# Patient Record
Sex: Female | Born: 1990 | Race: Black or African American | Hispanic: No | Marital: Single | Smoking: Former smoker
Health system: Southern US, Community
[De-identification: ages and names within clinical notes are randomized; demographics above are authoritative.]

## PROBLEM LIST (undated history)

## (undated) DIAGNOSIS — F329 Major depressive disorder, single episode, unspecified: Secondary | ICD-10-CM

## (undated) DIAGNOSIS — F32A Depression, unspecified: Secondary | ICD-10-CM

---
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
## 2011-07-21 HISTORY — PX: WISDOM TOOTH EXTRACTION: SHX21

## 2013-07-26 ENCOUNTER — Encounter (HOSPITAL_COMMUNITY): Payer: Self-pay | Admitting: Emergency Medicine

## 2013-07-26 ENCOUNTER — Emergency Department (INDEPENDENT_AMBULATORY_CARE_PROVIDER_SITE_OTHER)
Admission: EM | Admit: 2013-07-26 | Discharge: 2013-07-26 | Disposition: A | Discharge status: Home / Self Care | Payer: Self-pay | Source: Home / Self Care | Attending: Emergency Medicine | Admitting: Emergency Medicine

## 2013-07-26 DIAGNOSIS — K529 Noninfective gastroenteritis and colitis, unspecified: Secondary | ICD-10-CM

## 2013-07-26 DIAGNOSIS — K5289 Other specified noninfective gastroenteritis and colitis: Secondary | ICD-10-CM

## 2013-07-26 LAB — POCT I-STAT, CHEM 8
BUN: 12 mg/dL (ref 6–23)
CALCIUM ION: 1.29 mmol/L — AB (ref 1.12–1.23)
CHLORIDE: 104 meq/L (ref 96–112)
Creatinine, Ser: 0.8 mg/dL (ref 0.50–1.10)
Glucose, Bld: 109 mg/dL — ABNORMAL HIGH (ref 70–99)
HCT: 37 % (ref 36.0–46.0)
Hemoglobin: 12.6 g/dL (ref 12.0–15.0)
Potassium: 3.8 mEq/L (ref 3.7–5.3)
Sodium: 140 mEq/L (ref 137–147)
TCO2: 25 mmol/L (ref 0–100)

## 2013-07-26 MED ORDER — DIPHENOXYLATE-ATROPINE 2.5-0.025 MG PO TABS: 1.0000 | ORAL_TABLET | Freq: Four times a day (QID) | ORAL | Status: DC | PRN

## 2013-07-26 MED ORDER — ONDANSETRON 4 MG PO TBDP
8.0000 mg | ORAL_TABLET | Freq: Once | ORAL | Status: AC
  Administered 2013-07-26: 8 mg via ORAL

## 2013-07-26 MED ORDER — SODIUM CHLORIDE 0.9 % IV SOLN
INTRAVENOUS | Status: DC
  Administered 2013-07-26: 10:00:00 via INTRAVENOUS

## 2013-07-26 MED ORDER — GI COCKTAIL ~~LOC~~
ORAL | Status: AC
  Filled 2013-07-26: qty 30

## 2013-07-26 MED ORDER — ONDANSETRON 4 MG PO TBDP
ORAL_TABLET | ORAL | Status: AC
  Filled 2013-07-26: qty 2

## 2013-07-26 MED ORDER — ONDANSETRON 8 MG PO TBDP: 8.0000 mg | ORAL_TABLET | Freq: Three times a day (TID) | ORAL | Status: DC | PRN

## 2013-07-26 MED ORDER — GI COCKTAIL ~~LOC~~
30.0000 mL | Freq: Once | ORAL | Status: AC
  Administered 2013-07-26: 30 mL via ORAL

## 2013-07-26 NOTE — Discharge Instructions (Signed)

## 2013-07-26 NOTE — ED Notes (Signed)
Pt c/o abd pain onset las night w/sxs that include: diarrhea and vomiting Reports she's had 3 loose stools today and about 6-7 episodes of vomiting Denies: fevers... Has been increasing her fluid intake Alert w/no signs of acute distress.

## 2013-07-26 NOTE — ED Notes (Signed)
Pt is feeling much better.

## 2013-07-26 NOTE — ED Provider Notes (Signed)
Chief Complaint   Chief Complaint  Patient presents with  . Abdominal Pain     History of Present Illness   Anita Christensen is 23 year old female who has had a two-day history of dizziness, headache, nausea, and vomiting. The vomitus was blood-streaked. She vomited 6-7 times today. She also had diarrhea. She had 3 diarrheal stools per day. The diarrhea was also blood-streaked. She's felt weakness, dizziness, and had some muscle cramps. She denies any URI symptoms or abdominal pain. She's had no recent foreign travel, suspicious ingestions, exposures, or animal exposure.  Review of Systems   Other than as noted above, the patient denies any of the following symptoms: Systemic:  No fevers, chills, sweats, weight loss or gain, fatigue, or tiredness. ENT:  No nasal congestion, rhinorrhea, or sore throat. Lungs:  No cough, wheezing, or shortness of breath. Cardiac:  No chest pain, syncope, or presyncope. GI:  No abdominal pain, nausea, vomiting, anorexia, diarrhea, constipation, blood in stool or vomitus. GU:  No dysuria, frequency, or urgency.  PMFSH   Past medical history, family history, social history, meds, and allergies were reviewed. She has Sjogren's syndrome and bipolar disorder. She takes Abilify, Xanax, Lexapro.  Physical Exam     Vital signs:  BP 112/78  Pulse 82  Temp(Src) 98.2 F (36.8 C) (Oral)  Resp 18  Ht 5\' 6"  (1.676 m)  Wt 198 lb (89.812 kg)  BMI 31.97 kg/m2  SpO2 100%  LMP 07/19/2013 Filed Vitals:   07/26/13 0928 07/26/13 1012 Supine  07/26/13 1013 Sitting  07/26/13 1013 Standing   BP: 127/81 116/74 118/70 112/78  Pulse:  62 76 82  Temp: 98.2 F (36.8 C)     TempSrc: Oral     Resp: 18     Height: 5\' 6"  (1.676 m)     Weight: 198 lb (89.812 kg)     SpO2: 100%      General:  Alert and oriented.  In no distress.  Skin warm and dry.  Good skin turgor, brisk capillary refill. ENT:  No scleral icterus, moist mucous membranes, no oral lesions, pharynx  clear. Lungs:  Breath sounds clear and equal bilaterally.  No wheezes, rales, or rhonchi. Heart:  Rhythm regular, without extrasystoles.  No gallops or murmers. Abdomen:  Soft and nontender, no organomegaly or mass. Bowel sounds are hyperactive. Skin: Clear, warm, and dry.  Good turgor.  Brisk capillary refill.  Labs   Results for orders placed during the hospital encounter of 07/26/13  POCT I-STAT, CHEM 8      Result Value Range   Sodium 140  137 - 147 mEq/L   Potassium 3.8  3.7 - 5.3 mEq/L   Chloride 104  96 - 112 mEq/L   BUN 12  6 - 23 mg/dL   Creatinine, Ser 6.04  0.50 - 1.10 mg/dL   Glucose, Bld 540 (*) 70 - 99 mg/dL   Calcium, Ion 9.81 (*) 1.12 - 1.23 mmol/L   TCO2 25  0 - 100 mmol/L   Hemoglobin 12.6  12.0 - 15.0 g/dL   HCT 19.1  47.8 - 29.5 %     Course in Urgent Care Center   She was given Zofran ODT 8 mg sublingually and 1 L of normal saline intravenously. Thereafter she felt better.   Assessment   The encounter diagnosis was Gastroenteritis.  With mild dehydration.  Plan   1.  Meds:  The following meds were prescribed:   Discharge Medication List as of 07/26/2013 11:27 AM  START taking these medications   Details  diphenoxylate-atropine (LOMOTIL) 2.5-0.025 MG per tablet Take 1 tablet by mouth 4 (four) times daily as needed for diarrhea or loose stools., Starting 07/26/2013, Until Discontinued, Print    ondansetron (ZOFRAN ODT) 8 MG disintegrating tablet Take 1 tablet (8 mg total) by mouth every 8 (eight) hours as needed for nausea., Starting 07/26/2013, Until Discontinued, Normal        2.  Patient Education/Counseling:  The patient was given appropriate handouts, self care instructions, and instructed in symptomatic relief. The patient was told to stay on clear liquids for the remainder of the day, then advance to a B.R.A.T. diet starting tomorrow.  3.  Follow up:  The patient was told to follow up here if no better in 2 to 3 days, or sooner if becoming worse  in any way, and given some red flag symptoms such as persistent vomitng, high fever, severe abdominal pain, or any GI bleeding which would prompt immediate return.        Reuben Likesavid C Veva Grimley, MD 07/26/13 [phone]

## 2013-10-03 ENCOUNTER — Emergency Department (INDEPENDENT_AMBULATORY_CARE_PROVIDER_SITE_OTHER)
Admission: EM | Admit: 2013-10-03 | Discharge: 2013-10-03 | Disposition: A | Discharge status: Home / Self Care | Payer: Self-pay | Source: Home / Self Care

## 2013-10-03 ENCOUNTER — Encounter (HOSPITAL_COMMUNITY): Payer: Self-pay | Admitting: Emergency Medicine

## 2013-10-03 DIAGNOSIS — A084 Viral intestinal infection, unspecified: Secondary | ICD-10-CM

## 2013-10-03 DIAGNOSIS — A088 Other specified intestinal infections: Secondary | ICD-10-CM

## 2013-10-03 MED ORDER — ONDANSETRON 4 MG PO TBDP
4.0000 mg | ORAL_TABLET | Freq: Once | ORAL | Status: AC
  Administered 2013-10-03: 4 mg via ORAL

## 2013-10-03 MED ORDER — ONDANSETRON HCL 4 MG PO TABS: 4.0000 mg | ORAL_TABLET | Freq: Three times a day (TID) | ORAL | Status: DC | PRN

## 2013-10-03 MED ORDER — ONDANSETRON 4 MG PO TBDP
ORAL_TABLET | ORAL | Status: AC
  Filled 2013-10-03: qty 1

## 2013-10-03 MED ORDER — SODIUM CHLORIDE 0.9 % IV SOLN
Freq: Once | INTRAVENOUS | Status: AC
  Administered 2013-10-03: 15:00:00 via INTRAVENOUS

## 2013-10-03 NOTE — ED Notes (Signed)
C/o N, V and D onset Sat.  D x 10 on Sat. and continuous. Vomiting onset Sunday V 4-5 times, V x 8 Mon., V 2-3 x today.  No fever noted.  Can't keep water or g'ale down today.  Aching all over and has low abd. pain

## 2013-10-03 NOTE — ED Provider Notes (Signed)
CSN: [id number]     Arrival date & time 10/03/13  1248 History   First MD Initiated Contact with Patient 10/03/13 1431     No chief complaint on file.  (Consider location/radiation/quality/duration/timing/severity/associated sxs/prior Treatment) HPI Comments: 23 year old female complaining of nausea vomiting and diarrhea for 3 days. She has vomited numerous times and diarrhea numerous times as above. The number of the vomiting episodes has decreased in the last few hours he continues to have diarrhea. This is associated with fatigue malaise, myalgias and soreness across the lower abdomen. Denies known fever   History reviewed. No pertinent past medical history. Past Surgical History  Procedure Laterality Date  . Wisdom tooth extraction  2013   Family History  Problem Relation Age of Onset  . Cancer Mother     breaast   History  Substance Use Topics  . Smoking status: Never Smoker   . Smokeless tobacco: Not on file  . Alcohol Use: Yes     Comment: occasional   OB History   Grav Para Term Preterm Abortions TAB SAB Ect Mult Living                 Review of Systems  Constitutional: Positive for activity change, appetite change and fatigue. Negative for fever.  HENT: Negative.   Respiratory: Negative.   Cardiovascular: Negative.   Gastrointestinal: Positive for nausea, vomiting, abdominal pain and diarrhea.  Genitourinary: Negative.   Musculoskeletal: Positive for myalgias. Negative for arthralgias.  Neurological: Negative for tremors, facial asymmetry, speech difficulty and numbness.    Allergies  Review of patient's allergies indicates no known allergies.  Home Medications   Current Outpatient Rx  Name  Route  Sig  Dispense  Refill  . ALPRAZolam (XANAX) 1 MG tablet   Oral   Take 1 mg by mouth at bedtime as needed for anxiety.         . ARIPiprazole (ABILIFY) 10 MG tablet   Oral   Take 10 mg by mouth daily.         . diphenoxylate-atropine (LOMOTIL) 2.5-0.025  MG per tablet   Oral   Take 1 tablet by mouth 4 (four) times daily as needed for diarrhea or loose stools.   16 tablet   0   . Escitalopram Oxalate (LEXAPRO PO)   Oral   Take by mouth.         . ondansetron (ZOFRAN ODT) 8 MG disintegrating tablet   Oral   Take 1 tablet (8 mg total) by mouth every 8 (eight) hours as needed for nausea.   20 tablet   0   . ondansetron (ZOFRAN) 4 MG tablet   Oral   Take 1 tablet (4 mg total) by mouth every 8 (eight) hours as needed for nausea. May cause constipation   20 tablet   0    BP 118/81  Pulse 124  Temp(Src) 98.6 F (37 C) (Oral)  Resp 16  SpO2 99%  LMP 09/19/2013 Physical Exam  Nursing note and vitals reviewed. Constitutional: She is oriented to person, place, and time. She appears well-developed and well-nourished. No distress.  HENT:  Mouth/Throat: Oropharynx is clear and moist. No oropharyngeal exudate.   Bilateral TMs are normal  Eyes: Conjunctivae and EOM are normal.  Neck: Normal range of motion. Neck supple.  Cardiovascular: Normal rate, regular rhythm and normal heart sounds.   Pulmonary/Chest: Effort normal and breath sounds normal. No respiratory distress. She has no wheezes.  Abdominal: Soft. Bowel sounds are normal. She exhibits  no distension and no mass. There is no rebound and no guarding.  Mild tenderness across the lower abdomen.  Musculoskeletal: She exhibits no edema and no tenderness.  Lymphadenopathy:    She has no cervical adenopathy.  Neurological: She is alert and oriented to person, place, and time.  Skin: Skin is warm and dry.  Psychiatric: She has a normal mood and affect.    ED Course  Procedures (including critical care time) Labs Review Labs Reviewed - No data to display Imaging Review No results found.   MDM   1. Viral gastroenteritis      IV NS 1L over an hr.  zofran 4 mg po Rx zofran 4 mg q 6-8 h prn immodium AD 1 tid prn diarrhea stools >34/d. Do not stop your diarrhea, just  slow it down Instructions for diet, clear fluids in small frequent volumes, advance as tolerated.   1625: After 1 L of NS and zofran pt st feels much better. No V or D while here.           D/C home in improved condition. No work for next 3 days. Wash hands as directed.     Hayden Rasmussenavid Juandedios Dudash, NP 10/03/13 [phone]

## 2013-10-03 NOTE — Discharge Instructions (Signed)
Diet for Diarrhea, Adult °Frequent, runny stools (diarrhea) may be caused or worsened by food or drink. Diarrhea may be relieved by changing your diet. Since diarrhea can last up to 7 days, it is easy for you to lose too much fluid from the body and become dehydrated. Fluids that are lost need to be replaced. Along with a modified diet, make sure you drink enough fluids to keep your urine clear or pale yellow. °DIET INSTRUCTIONS °· Ensure adequate fluid intake (hydration): have 1 cup (8 oz) of fluid for each diarrhea episode. Avoid fluids that contain simple sugars or sports drinks, fruit juices, whole milk products, and sodas. Your urine should be clear or pale yellow if you are drinking enough fluids. Hydrate with an oral rehydration solution that you can purchase at pharmacies, retail stores, and online. You can prepare an oral rehydration solution at home by mixing the following ingredients together: °·   tsp table salt. °· ¾ tsp baking soda. °·  tsp salt substitute containing potassium chloride. °· 1  tablespoons sugar. °· 1 L (34 oz) of water. °· Certain foods and beverages may increase the speed at which food moves through the gastrointestinal (GI) tract. These foods and beverages should be avoided and include: °· Caffeinated and alcoholic beverages. °· High-fiber foods, such as raw fruits and vegetables, nuts, seeds, and whole grain breads and cereals. °· Foods and beverages sweetened with sugar alcohols, such as xylitol, sorbitol, and mannitol. °· Some foods may be well tolerated and may help thicken stool including: °· Starchy foods, such as rice, toast, pasta, low-sugar cereal, oatmeal, grits, baked potatoes, crackers, and bagels.   °· Bananas.   °· Applesauce. °· Add probiotic-rich foods to help increase healthy bacteria in the GI tract, such as yogurt and fermented milk products. °RECOMMENDED FOODS AND BEVERAGES °Starches °Choose foods with less than 2 g of fiber per serving. °· Recommended:  White,  French, and pita breads, plain rolls, buns, bagels. Plain muffins, matzo. Soda, saltine, or graham crackers. Pretzels, melba toast, zwieback. Cooked cereals made with water: cornmeal, farina, cream cereals. Dry cereals: refined corn, wheat, rice. Potatoes prepared any way without skins, refined macaroni, spaghetti, noodles, refined rice. °· Avoid:  Bread, rolls, or crackers made with whole wheat, multi-grains, rye, bran seeds, nuts, or coconut. Corn tortillas or taco shells. Cereals containing whole grains, multi-grains, bran, coconut, nuts, raisins. Cooked or dry oatmeal. Coarse wheat cereals, granola. Cereals advertised as "high-fiber." Potato skins. Whole grain pasta, wild or brown rice. Popcorn. Sweet potatoes, yams. Sweet rolls, doughnuts, waffles, pancakes, sweet breads. °Vegetables °· Recommended: Strained tomato and vegetable juices. Most well-cooked and canned vegetables without seeds. Fresh: Tender lettuce, cucumber without the skin, cabbage, spinach, bean sprouts. °· Avoid: Fresh, cooked, or canned: Artichokes, baked beans, beet greens, broccoli, Brussels sprouts, corn, kale, legumes, peas, sweet potatoes. Cooked: Green or red cabbage, spinach. Avoid large servings of any vegetables because vegetables shrink when cooked, and they contain more fiber per serving than fresh vegetables. °Fruit °· Recommended: Cooked or canned: Apricots, applesauce, cantaloupe, cherries, fruit cocktail, grapefruit, grapes, kiwi, mandarin oranges, peaches, pears, plums, watermelon. Fresh: Apples without skin, ripe banana, grapes, cantaloupe, cherries, grapefruit, peaches, oranges, plums. Keep servings limited to ½ cup or 1 piece. °· Avoid: Fresh: Apples with skin, apricots, mangoes, pears, raspberries, strawberries. Prune juice, stewed or dried prunes. Dried fruits, raisins, dates. Large servings of all fresh fruits. °Protein °· Recommended: Ground or well-cooked tender beef, ham, veal, lamb, pork, or poultry. Eggs. Fish,  oysters, shrimp,   lobster, other seafoods. Liver, organ meats. °· Avoid: Tough, fibrous meats with gristle. Peanut butter, smooth or chunky. Cheese, nuts, seeds, legumes, dried peas, beans, lentils. °Dairy °· Recommended: Yogurt, lactose-free milk, kefir, drinkable yogurt, buttermilk, soy milk, or plain hard cheese. °· Avoid: Milk, chocolate milk, beverages made with milk, such as milkshakes. °Soups °· Recommended: Bouillon, broth, or soups made from allowed foods. Any strained soup. °· Avoid: Soups made from vegetables that are not allowed, cream or milk-based soups. °Desserts and Sweets °· Recommended: Sugar-free gelatin, sugar-free frozen ice pops made without sugar alcohol. °· Avoid: Plain cakes and cookies, pie made with fruit, pudding, custard, cream pie. Gelatin, fruit, ice, sherbet, frozen ice pops. Ice cream, ice milk without nuts. Plain hard candy, honey, jelly, molasses, syrup, sugar, chocolate syrup, gumdrops, marshmallows. °Fats and Oils °· Recommended: Limit fats to less than 8 tsp per day. °· Avoid: Seeds, nuts, olives, avocados. Margarine, butter, cream, mayonnaise, salad oils, plain salad dressings. Plain gravy, crisp bacon without rind. °Beverages °· Recommended: Water, decaffeinated teas, oral rehydration solutions, sugar-free beverages not sweetened with sugar alcohols. °· Avoid: Fruit juices, caffeinated beverages (coffee, tea, soda), alcohol, sports drinks, or lemon-lime soda. °Condiments °· Recommended: Ketchup, mustard, horseradish, vinegar, cocoa powder. Spices in moderation: allspice, basil, bay leaves, celery powder or leaves, cinnamon, cumin powder, curry powder, ginger, mace, marjoram, onion or garlic powder, oregano, paprika, parsley flakes, ground pepper, rosemary, sage, savory, tarragon, thyme, turmeric. °· Avoid: Coconut, honey. °Document Released: 09/26/2003 Document Revised: 03/30/2012 Document Reviewed: 11/20/2011 °ExitCare® Patient Information ©2014 [employer]. ° °Viral  Gastroenteritis °Viral gastroenteritis is also known as stomach flu. This condition affects the stomach and intestinal tract. It can cause sudden diarrhea and vomiting. The illness typically lasts 3 to 8 days. Most people develop an immune response that eventually gets rid of the virus. While this natural response develops, the virus can make you quite ill. °CAUSES  °Many different viruses can cause gastroenteritis, such as rotavirus or noroviruses. You can catch one of these viruses by consuming contaminated food or water. You may also catch a virus by sharing utensils or other personal items with an infected person or by touching a contaminated surface. °SYMPTOMS  °The most common symptoms are diarrhea and vomiting. These problems can cause a severe loss of body fluids (dehydration) and a body salt (electrolyte) imbalance. Other symptoms may include: °· Fever. °· Headache. °· Fatigue. °· Abdominal pain. °DIAGNOSIS  °Your caregiver can usually diagnose viral gastroenteritis based on your symptoms and a physical exam. A stool sample may also be taken to test for the presence of viruses or other infections. °TREATMENT  °This illness typically goes away on its own. Treatments are aimed at rehydration. The most serious cases of viral gastroenteritis involve vomiting so severely that you are not able to keep fluids down. In these cases, fluids must be given through an intravenous line (IV). °HOME CARE INSTRUCTIONS  °· Drink enough fluids to keep your urine clear or pale yellow. Drink small amounts of fluids frequently and increase the amounts as tolerated. °· Ask your caregiver for specific rehydration instructions. °· Avoid: °· Foods high in sugar. °· Alcohol. °· Carbonated drinks. °· Tobacco. °· Juice. °· Caffeine drinks. °· Extremely hot or cold fluids. °· Fatty, greasy foods. °· Too much intake of anything at one time. °· Dairy products until 24 to 48 hours after diarrhea stops. °· You may consume probiotics.  Probiotics are active cultures of beneficial bacteria. They may lessen the amount and number   of diarrheal stools in adults. Probiotics can be found in yogurt with active cultures and in supplements. °· Wash your hands well to avoid spreading the virus. °· Only take over-the-counter or prescription medicines for pain, discomfort, or fever as directed by your caregiver. Do not give aspirin to children. Antidiarrheal medicines are not recommended. °· Ask your caregiver if you should continue to take your regular prescribed and over-the-counter medicines. °· Keep all follow-up appointments as directed by your caregiver. °SEEK IMMEDIATE MEDICAL CARE IF:  °· You are unable to keep fluids down. °· You do not urinate at least once every 6 to 8 hours. °· You develop shortness of breath. °· You notice blood in your stool or vomit. This may look like coffee grounds. °· You have abdominal pain that increases or is concentrated in one small area (localized). °· You have persistent vomiting or diarrhea. °· You have a fever. °· The patient is a child younger than 3 months, and he or she has a fever. °· The patient is a child older than 3 months, and he or she has a fever and persistent symptoms. °· The patient is a child older than 3 months, and he or she has a fever and symptoms suddenly get worse. °· The patient is a baby, and he or she has no tears when crying. °MAKE SURE YOU:  °· Understand these instructions. °· Will watch your condition. °· Will get help right away if you are not doing well or get worse. °Document Released: 07/06/2005 Document Revised: 09/28/2011 Document Reviewed: 04/22/2011 °ExitCare® Patient Information ©2014 [employer]. ° °

## 2013-10-03 NOTE — ED Provider Notes (Signed)
Medical screening examination/treatment/procedure(s) were performed by resident physician or non-physician practitioner and as supervising physician I was immediately available for consultation/collaboration.   Barkley BrunsKINDL,Jackeline Gutknecht DOUGLAS MD.   Linna HoffJames D Deejay Koppelman, MD 10/03/13 [phone]

## 2013-10-03 NOTE — ED Notes (Signed)
Drank 1 cup of water without vomiting.  Went BR x 2 while here and had diarrhea.

## 2013-10-12 NOTE — ED Notes (Signed)
Accessed record to provide patient with a note --reprint of existing note

## 2015-08-07 ENCOUNTER — Ambulatory Visit: Payer: Self-pay | Admitting: Gynecology

## 2015-08-12 ENCOUNTER — Ambulatory Visit: Payer: Self-pay | Admitting: Gynecology

## 2015-12-05 ENCOUNTER — Ambulatory Visit (HOSPITAL_COMMUNITY)
Admission: EM | Admit: 2015-12-05 | Discharge: 2015-12-05 | Disposition: A | Discharge status: Home / Self Care | Payer: 59 | Attending: Physician Assistant | Admitting: Physician Assistant

## 2015-12-05 ENCOUNTER — Encounter (HOSPITAL_COMMUNITY): Payer: Self-pay | Admitting: Emergency Medicine

## 2015-12-05 DIAGNOSIS — L03032 Cellulitis of left toe: Secondary | ICD-10-CM | POA: Diagnosis not present

## 2015-12-05 MED ORDER — CEPHALEXIN 500 MG PO CAPS
500.0000 mg | ORAL_CAPSULE | Freq: Four times a day (QID) | ORAL | Status: DC
Start: 2015-12-05 — End: 2015-12-14

## 2015-12-05 NOTE — ED Notes (Signed)
Pt has been having issues with an ingrown toenail on her left great toe.  Pt has pain when standing for long periods of time or when something touches it or wearing closed toed shoes.

## 2015-12-05 NOTE — Discharge Instructions (Signed)
Paronychia  °Paronychia is an infection of the skin. It happens near a fingernail or toenail. It may cause pain and swelling around the nail. Usually, it is not serious and it clears up with treatment. °HOME CARE °· Soak the fingers or toes in warm water as told by your doctor. You may be told to do this for 20 minutes, 2-3 times a day. °· Keep the area dry when you are not soaking it. °· Take medicines only as told by your doctor. °· If you were given an antibiotic medicine, finish all of it even if you start to feel better. °· Keep the affected area clean. °· Do not try to drain a fluid-filled bump yourself. °· Wear rubber gloves when putting your hands in water. °· Wear gloves if your hands might touch cleaners or chemicals. °· Follow your doctor's instructions about: °¨ Wound care. °¨ Bandage (dressing) changes and removal. °GET HELP IF: °· Your symptoms get worse or do not improve. °· You have a fever or chills. °· You have redness spreading from the affected area. °· You have more fluid, blood, or pus coming from the affected area. °· Your finger or knuckle is swollen or is hard to move. °  °This information is not intended to replace advice given to you by your health care provider. Make sure you discuss any questions you have with your health care provider. °  °Document Released: 06/24/2009 Document Revised: 11/20/2014 Document Reviewed: 06/13/2014 °Elsevier Interactive Patient Education ©2016 [employer] ° °

## 2015-12-14 ENCOUNTER — Ambulatory Visit (HOSPITAL_COMMUNITY)
Admission: EM | Admit: 2015-12-14 | Discharge: 2015-12-14 | Disposition: A | Discharge status: Home / Self Care | Payer: 59 | Attending: Emergency Medicine | Admitting: Emergency Medicine

## 2015-12-14 ENCOUNTER — Encounter (HOSPITAL_COMMUNITY): Payer: Self-pay | Admitting: *Deleted

## 2015-12-14 DIAGNOSIS — L600 Ingrowing nail: Secondary | ICD-10-CM

## 2015-12-14 HISTORY — DX: Depression, unspecified: F32.A

## 2015-12-14 HISTORY — DX: Major depressive disorder, single episode, unspecified: F32.9

## 2015-12-14 MED ORDER — CEPHALEXIN 500 MG PO CAPS: 500.0000 mg | ORAL_CAPSULE | Freq: Four times a day (QID) | ORAL | Status: DC

## 2015-12-14 NOTE — Discharge Instructions (Signed)
The toenail is ingrown. This is why you are getting recurrent infections. Your toenail, or at least the involved part, needs to be removed. Take Keflex 4 times a day for 1 week. Soak the toe in Epsom salts twice a day for the next week. Call [facility] on Tuesday to set up an appointment to remove the toenail.

## 2015-12-14 NOTE — ED Provider Notes (Signed)
CSN: [id number]     Arrival date & time 12/14/15  1333 History   First MD Initiated Contact with Patient 12/14/15 1444     Chief Complaint  Patient presents with  . Ingrown Toenail   (Consider location/radiation/quality/duration/timing/severity/associated sxs/prior Treatment) HPI  She is a 25 year old woman here for evaluation of left ingrown toenail. She states this has been a recurring problem for several months. She was seen here about 2 weeks ago and treated with antibiotics. She states the toenail did improve, but then she stubbed it and developed recurring pain, redness, swelling, and drainage. She has been doing Epsom salt soaks about every other day.  Past Medical History  Diagnosis Date  . Depression    Past Surgical History  Procedure Laterality Date  . Wisdom tooth extraction  2013   Family History  Problem Relation Age of Onset  . Cancer Mother     breaast   Social History  Substance Use Topics  . Smoking status: Never Smoker   . Smokeless tobacco: None  . Alcohol Use: Yes     Comment: occasional   OB History    No data available     Review of Systems As in history of present illness Allergies  Review of patient's allergies indicates no known allergies.  Home Medications   Prior to Admission medications   Medication Sig Start Date End Date Taking? Authorizing Provider  ALPRAZolam Prudy Feeler(XANAX) 1 MG tablet Take 1 mg by mouth at bedtime as needed for anxiety.   Yes Historical Provider, MD  cephALEXin (KEFLEX) 500 MG capsule Take 1 capsule (500 mg total) by mouth 4 (four) times daily. 12/14/15   Charm RingsErin J Honig, MD   Meds Ordered and Administered this Visit  Medications - No data to display  BP 138/85 mmHg  Pulse 75  Temp(Src) 98.2 F (36.8 C) (Oral)  Resp 16  SpO2 100%  LMP 12/13/2015 No data found.   Physical Exam  Constitutional: She is oriented to person, place, and time. She appears well-developed and well-nourished. No distress.  Cardiovascular:  Normal rate.   Pulmonary/Chest: Effort normal.  Neurological: She is alert and oriented to person, place, and time.  Skin:  Left great toe: There is erythema and swelling along the lateral nail edge. This is quite tender to palpation. I'm unable to express any pus today. Toenail is obviously ingrown, worse on the lateral aspect.    ED Course  Procedures (including critical care time)  Labs Review Labs Reviewed - No data to display  Imaging Review No results found.   MDM   1. Ingrown left big toenail    Offered toenail removal today. Given the tools available to me, I discussed with her that I may need to remove the entire toenail rather than just the lateral third. She would prefer to avoid that if possible. As an alternative, we have decided to repeat the course of Keflex and increase Epsom salt soaks. She will follow-up at [facility] for lateral nail removal next week.    Charm RingsErin J Honig, MD 12/14/15 [id number]

## 2015-12-14 NOTE — ED Notes (Signed)
Patient here for follow up after visit on 5/18 for ingrown toenail. Patient reports completing full course of antibiotic and has not seen any relief. Also notes hitting to and dropping something on it yesterday. Obvious infection noted to left lateral distal aspect of let toe. Pain increases with touching, pressure, and standing.

## 2015-12-30 ENCOUNTER — Encounter: Payer: Self-pay | Admitting: Podiatry

## 2015-12-30 ENCOUNTER — Ambulatory Visit (INDEPENDENT_AMBULATORY_CARE_PROVIDER_SITE_OTHER): Payer: 59 | Admitting: Podiatry

## 2015-12-30 VITALS — BP 102/76 | HR 92 | Resp 12

## 2015-12-30 DIAGNOSIS — L600 Ingrowing nail: Secondary | ICD-10-CM

## 2015-12-30 DIAGNOSIS — R5383 Other fatigue: Secondary | ICD-10-CM

## 2015-12-30 DIAGNOSIS — R5381 Other malaise: Secondary | ICD-10-CM | POA: Insufficient documentation

## 2015-12-30 DIAGNOSIS — M2550 Pain in unspecified joint: Secondary | ICD-10-CM | POA: Insufficient documentation

## 2015-12-30 MED ORDER — NEOMYCIN-POLYMYXIN-HC 1 % OT SOLN: OTIC | Status: DC

## 2015-12-30 NOTE — Progress Notes (Signed)
   Subjective:    Patient ID: Anita LoronBrandi N Lupi, female    DOB: [birth year], 25 y.o.   MRN: [id number]  HPI: She presents today with a one-month duration of an ingrown nail border hallux left.    Review of Systems  Skin: Positive for color change.       Objective:   Physical Exam: Vital signs are stable alert and oriented 3. Pulses are palpable. Neurologic system is intact. Degenerative flexor intact. Muscle strength is normal bilateral. Orthopedic evaluation demonstrates although is full range of motion at crepitation. Extensive admission physical under cutis shuffling a nail margins were hallux left. Initial tissues present. No malodor no drainage.        Assessment & Plan:  Ingrown nail paronychia abscess fever or hallux left.  Plan: Chemical matrixectomy was performed today after local anesthesia was diminished. She tolerated the procedure well. No complications. Start her on Cortisporin Otic after soaking twice a day in Betadine warm water. We will follow up with her in 1 week for reevaluation.

## 2015-12-30 NOTE — Patient Instructions (Signed)

## 2016-01-10 ENCOUNTER — Ambulatory Visit: Payer: Self-pay | Admitting: Podiatry

## 2016-01-13 ENCOUNTER — Ambulatory Visit: Payer: 59 | Admitting: Podiatry

## 2016-02-05 ENCOUNTER — Ambulatory Visit: Payer: 59 | Admitting: Podiatry

## 2016-12-02 ENCOUNTER — Encounter: Payer: Self-pay | Admitting: Gynecology

## 2018-09-01 ENCOUNTER — Encounter: Payer: Self-pay | Admitting: *Deleted

## 2018-09-01 ENCOUNTER — Other Ambulatory Visit: Payer: Self-pay

## 2018-09-01 ENCOUNTER — Emergency Department (INDEPENDENT_AMBULATORY_CARE_PROVIDER_SITE_OTHER)
Admission: EM | Admit: 2018-09-01 | Discharge: 2018-09-01 | Disposition: A | Discharge status: Home / Self Care | Payer: Self-pay | Source: Home / Self Care

## 2018-09-01 DIAGNOSIS — M542 Cervicalgia: Secondary | ICD-10-CM

## 2018-09-01 MED ORDER — METHOCARBAMOL 500 MG PO TABS: 500.0000 mg | ORAL_TABLET | Freq: Two times a day (BID) | ORAL | 0 refills | Status: AC

## 2018-09-01 MED ORDER — IBUPROFEN 600 MG PO TABS: 600.0000 mg | ORAL_TABLET | Freq: Four times a day (QID) | ORAL | 0 refills | Status: AC | PRN
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
## 2018-09-01 NOTE — Discharge Instructions (Addendum)
Return if any problems.

## 2018-11-15 ENCOUNTER — Other Ambulatory Visit: Payer: Self-pay

## 2018-11-15 ENCOUNTER — Emergency Department (HOSPITAL_BASED_OUTPATIENT_CLINIC_OR_DEPARTMENT_OTHER)
Admission: EM | Admit: 2018-11-15 | Discharge: 2018-11-15 | Disposition: A | Discharge status: Home / Self Care | Payer: Managed Care, Other (non HMO) | Attending: Emergency Medicine | Admitting: Emergency Medicine

## 2018-11-15 ENCOUNTER — Encounter (HOSPITAL_BASED_OUTPATIENT_CLINIC_OR_DEPARTMENT_OTHER): Payer: Self-pay | Admitting: Adult Health

## 2018-11-15 ENCOUNTER — Emergency Department (HOSPITAL_BASED_OUTPATIENT_CLINIC_OR_DEPARTMENT_OTHER): Payer: Managed Care, Other (non HMO)

## 2018-11-15 DIAGNOSIS — R05 Cough: Secondary | ICD-10-CM | POA: Diagnosis not present

## 2018-11-15 DIAGNOSIS — R0602 Shortness of breath: Secondary | ICD-10-CM | POA: Insufficient documentation

## 2018-11-15 DIAGNOSIS — F17210 Nicotine dependence, cigarettes, uncomplicated: Secondary | ICD-10-CM | POA: Insufficient documentation

## 2018-11-15 DIAGNOSIS — Z20822 Contact with and (suspected) exposure to covid-19: Secondary | ICD-10-CM

## 2018-11-15 DIAGNOSIS — Z20828 Contact with and (suspected) exposure to other viral communicable diseases: Secondary | ICD-10-CM | POA: Diagnosis not present

## 2018-11-15 DIAGNOSIS — R059 Cough, unspecified: Secondary | ICD-10-CM

## 2018-11-15 MED ORDER — AEROCHAMBER PLUS FLO-VU MEDIUM MISC
1.0000 | Freq: Once | Status: AC
  Administered 2018-11-15: 11:00:00 1
  Filled 2018-11-15: qty 1

## 2018-11-15 MED ORDER — ALBUTEROL SULFATE HFA 108 (90 BASE) MCG/ACT IN AERS
2.0000 | INHALATION_SPRAY | Freq: Once | RESPIRATORY_TRACT | Status: AC
Start: 2018-11-15 — End: 2018-11-15
  Administered 2018-11-15: 11:00:00 2 via RESPIRATORY_TRACT
  Filled 2018-11-15: qty 6.7

## 2018-11-15 MED ORDER — BENZONATATE 100 MG PO CAPS: 100.0000 mg | ORAL_CAPSULE | Freq: Three times a day (TID) | ORAL | 0 refills | Status: DC

## 2018-11-15 MED ORDER — ONDANSETRON 4 MG PO TBDP: 4.0000 mg | ORAL_TABLET | Freq: Three times a day (TID) | ORAL | 0 refills | Status: AC | PRN

## 2018-11-15 MED FILL — ONDANSETRON ODT 4 MG TABLET: 4 | 6 days supply | Qty: 20 | Fill #0

## 2018-11-15 MED FILL — BENZONATATE 100 MG CAP: 100 | 7 days supply | Qty: 21 | Fill #0

## 2018-11-15 NOTE — Discharge Instructions (Addendum)
Your chest xray showed no evidence of acute abnormalities.  Patients who have symptoms consistent with COVID-19 should self isolated for: At least 3 days (72 hours) have passed since recovery, defined as resolution of fever without the use of fever reducing medications and improvement in respiratory symptoms (e.g., cough, shortness of breath), and At least 7 days have passed since symptoms first appeared.  For further information on COVID-19, you may refer to these resources: PrepaidPayroll.cahttps://www.[facility].edu/coronavirus https://king.net/https://www.[place].com/wellness/covid-19-information/ OptionRunner.ishttps://www.[place].gov/our-county/administration/coronavirus-updates https://www.[place].gov/i-want-to/learn-about/covid-19-information-and-updates  If you need assistance, financial or otherwise, especially due to self-isolation from COVID-19, please explore these resources: https://www.riddle-martin.info/https://www.nc211.org/coronavirus-covid-19 http://www.loveoutloudws.com/  To see needed items you are able to donate to our hospital systems or to those in need during this time, the sites below are good places to start: VerifiedStats.huhttps://www.[facility].edu/About-Us/Philanthropy/COVID19-How-You-Can-Help WikiOutlook.huhttps://www.[place].com/wellness/covid-19-information/covid-19-donations/  Your symptoms are likely consistent with a viral illness. Viruses do not require or respond to antibiotics. Treatment is symptomatic care and it is important to note that these symptoms may last for 7-14 days.   Hand washing: Wash your hands throughout the day, but especially before and after touching the face, using the restroom, sneezing, coughing, or touching surfaces that have been coughed or sneezed upon. Hydration: Symptoms of most illnesses will be intensified and complicated by dehydration. Dehydration can also extend the duration of symptoms. Drink plenty of fluids and get plenty of rest. You should be drinking at least half a liter of water an hour to stay  hydrated. Electrolyte drinks (ex. Gatorade, Powerade, Pedialyte) are also encouraged. You should be drinking enough fluids to make your urine light yellow, almost clear. If this is not the case, you are not drinking enough water. Please note that some of the treatments indicated below will not be effective if you are not adequately hydrated. Diet: Please concentrate on hydration, however, you may introduce food slowly.  Start with a clear liquid diet, progressed to a full liquid diet, and then bland solids as you are able. Pain or fever: Ibuprofen, Naproxen, or acetaminophen (generic for Tylenol) for pain or fever.  Antiinflammatory medications: Take 600 mg of ibuprofen every 6 hours or 440 mg (over the counter dose) to 500 mg (prescription dose) of naproxen every 12 hours for the next 3 days. After this time, these medications may be used as needed for pain. Take these medications with food to avoid upset stomach. Choose only one of these medications, do not take them together. Acetaminophen (generic for Tylenol): Should you continue to have additional pain while taking the ibuprofen or naproxen, you may add in acetaminophen as needed. Your daily total maximum amount of acetaminophen from all sources should be limited to 4000mg /day for persons without liver problems, or 2000mg /day for those with liver problems. Nausea/vomiting: Use the ondansetron (generic for Zofran) for nausea or vomiting.  This medication may not prevent all vomiting or nausea, but can help facilitate better hydration. Things that can help with nausea/vomiting also include peppermint/menthol candies, vitamin B12, and ginger. Diarrhea: May use medications such as loperamide (Imodium) or Bismuth subsalicylate (Pepto-Bismol). Cough: Use the benzonatate (generic for Tessalon) for cough.  Teas, warm liquids, broths, and honey can also help with cough. Albuterol: May use the albuterol as needed for instances of shortness of breath. Zyrtec  or Claritin: May add these medication daily to control underlying symptoms of congestion, sneezing, and other signs of allergies.  These medications are available over-the-counter. Generics: Cetirizine (generic for Zyrtec) and loratadine (generic for Claritin). Fluticasone: Use fluticasone (generic for Flonase), as directed, for nasal and sinus congestion.  This medication is available over-the-counter. Congestion: Plain guaifenesin (generic for plain Mucinex) may help relieve congestion. Saline sinus rinses and saline nasal sprays may also help relieve congestion. If you do not have high blood pressure, heart problems, or an allergy to such medications, you may also try phenylephrine or Sudafed. Sore throat: Warm liquids or Chloraseptic spray may help soothe a sore throat. Gargle twice a day with a salt water solution made from a half teaspoon of salt in a cup of warm water.  Follow up: Follow up with a primary care provider within the next two weeks should symptoms fail to resolve. Return: Return to the ED for significantly worsening symptoms, shortness of breath, persistent vomiting, large amounts of blood in stool, or any other major concerns.  For prescription assistance, may try using prescription discount sites or apps, such as goodrx.com
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
## 2018-11-15 NOTE — ED Provider Notes (Signed)
[facility] EMERGENCY DEPARTMENT Provider Note   CSN: [id number] Arrival date & time: 11/15/18  8657    History   Chief Complaint Chief Complaint  Patient presents with  . Shortness of Breath    HPI Anita Christensen is a 28 y.o. female.     HPI   Anita Christensen is a 28 y.o. female, with a history of depression, presenting to the ED with nonproductive cough and shortness of breath for the past 3 to 4 days.  Preceded by chills, fatigue, body aches, nausea, vomiting, diarrhea, nasal congestion, and rhinorrhea. 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  Denies fever, chest pain, syncope, abdominal pain, hematochezia/melena, rash, neck pain/stiffness, or any other complaints.    Past Medical History:  Diagnosis Date  . Depression     Patient Active Problem List   Diagnosis Date Noted  . Malaise and fatigue 12/30/2015  . Arthralgia of multiple joints 12/30/2015    Past Surgical History:  Procedure Laterality Date  . WISDOM TOOTH EXTRACTION  2013     OB History   No obstetric history on file.      Home Medications    Prior to Admission medications   Medication Sig Start Date End Date Taking? Authorizing Provider  benzonatate (TESSALON) 100 MG capsule Take 1 capsule (100 mg total) by mouth every 8 (eight) hours. 11/15/18   Makenize Messman C, PA-C  ibuprofen (ADVIL,MOTRIN) 600 MG tablet Take 1 tablet (600 mg total) by mouth every 6 (six) hours as needed. 09/01/18   Elson Areas, PA-C  methocarbamol (ROBAXIN) 500 MG tablet Take 1 tablet (500 mg total) by mouth 2 (two) times daily. 09/01/18   Elson Areas, PA-C  ondansetron (ZOFRAN ODT) 4 MG disintegrating tablet Take 1 tablet (4 mg total) by mouth every 8 (eight) hours as needed for nausea or vomiting. 11/15/18   Jorge Retz, Hillard Danker, PA-C    Family History Family History  Problem Relation Age of Onset  . Cancer Mother    breaast    Social History Social History   Tobacco Use  . Smoking status: Current Every Day Smoker    Packs/day: 0.25    Types: Cigarettes  . Smokeless tobacco: Never Used  Substance Use Topics  . Alcohol use: Yes    Comment: occasional  . Drug use: No     Allergies   Patient has no known allergies.   Review of Systems Review of Systems  Constitutional: Positive for chills and fatigue. Negative for fever.  HENT: Positive for congestion and rhinorrhea. Negative for ear discharge, ear pain, sinus pressure, sinus pain, sore throat, trouble swallowing and voice change.   Respiratory: Positive for cough and shortness of breath. Negative for chest tightness.   Cardiovascular: Negative for chest pain, palpitations and leg swelling.  Gastrointestinal: Positive for diarrhea, nausea and vomiting. Negative for abdominal pain and blood in stool.  Musculoskeletal: Positive for myalgias. Negative for back pain, neck pain and neck stiffness.  Skin: Negative for rash.  Neurological: Negative for weakness.  Psychiatric/Behavioral: Negative for confusion.  All other systems reviewed and are negative.    Physical Exam Updated Vital Signs BP (!) 119/99 (BP Location: Left Arm)   Pulse (!) 103   Temp 98.3 F (36.8 C) (Oral)   Resp (!) 24   Ht  (1.676 m)   Wt 90.7 kg   LMP 11/08/2018 (Exact  Date)   SpO2 100%   BMI 32.28 kg/m   Physical Exam Vitals signs and nursing note reviewed.  Constitutional:      General: She is not in acute distress.    Appearance: She is well-developed. She is not diaphoretic.  HENT:     Head: Normocephalic and atraumatic.     Nose: Nose normal.     Mouth/Throat:     Mouth: Mucous membranes are moist.     Pharynx: Oropharynx is clear.  Eyes:     Conjunctiva/sclera: Conjunctivae normal.  Neck:     Musculoskeletal: Normal range of motion and neck supple. No neck rigidity.  Cardiovascular:     Rate and Rhythm: Normal rate and regular rhythm.      Pulses: Normal pulses.     Heart sounds: Normal heart sounds.     Comments: Tactile temperature in the extremities appropriate and equal bilaterally.  Pulse of 96 during my exam. Pulmonary:     Effort: Pulmonary effort is normal. No respiratory distress.     Breath sounds: Examination of the right-lower field reveals wheezing. Examination of the left-lower field reveals wheezing. Wheezing present.     Comments: No increased work of breathing.  Speaks in full sentences without noted difficulty. Abdominal:     Palpations: Abdomen is soft.     Tenderness: There is no abdominal tenderness. There is no guarding.  Musculoskeletal:     Right lower leg: No edema.     Left lower leg: No edema.  Lymphadenopathy:     Cervical: No cervical adenopathy.  Skin:    General: Skin is warm and dry.  Neurological:     Mental Status: She is alert.  Psychiatric:        Mood and Affect: Mood and affect normal.        Speech: Speech normal.        Behavior: Behavior normal.      ED Treatments / Results  Labs (all labs ordered are listed, but only abnormal results are displayed) Labs Reviewed - No data to display  EKG None  Radiology Dg Chest Portable 1 View  Result Date: 11/15/2018 CLINICAL DATA:  Cough, shortness of breath EXAM: PORTABLE CHEST 1 VIEW COMPARISON:  None. FINDINGS: The heart size and mediastinal contours are within normal limits. Both lungs are clear. The visualized skeletal structures are unremarkable. IMPRESSION: No active disease. Electronically Signed   By: Elige Ko   On: 11/15/2018 10:56    Procedures Procedures (including critical care time)  Medications Ordered in ED Medications  albuterol (VENTOLIN HFA) 108 (90 Base) MCG/ACT inhaler 2 puff (2 puffs Inhalation Given 11/15/18 1100)  AeroChamber Plus Flo-Vu Medium MISC 1 each (1 each Other Given 11/15/18 1101)     Initial Impression / Assessment and Plan / ED Course  I have reviewed the triage vital signs and the  nursing notes.  Pertinent labs & imaging results that were available during my care of the patient were reviewed by me and considered in my medical decision making (see chart for details).        Patient presents with cough, shortness of breath, and diarrhea.  Patient is nontoxic appearing, afebrile, not tachycardic on my exam, not tachypneic on my exam, not hypotensive, excellent SPO2 on room air, and is in no apparent distress.  Exposure to someone with confirmed COVID-19.  Suspect COVID-19 infection or other viral illness. No acute abnormality on chest x-ray. The patient was given instructions for home care as well  as return precautions. Patient voices understanding of these instructions, accepts the plan, and is comfortable with discharge.    Radiological studies were personally reviewed by me.   The possibility for viral cause for the patient's symptoms, including the possibility for COVID-19 infection, was discussed.  Per current State of [place] and [place] recommendations, we will not be testing the patient for influenza, COVID-19, or other respiratory viruses.  Patient does not meet criteria for admission or respiratory support at this time.  Reasoning for not testing as well as isolation recommendation and cessation of isolation criteria discussed.  Lewie LoronBrandi N Souter was evaluated in Emergency Department on 11/15/2018 for the symptoms described in the history of present illness. She was evaluated in the context of the global COVID-19 pandemic, which necessitated consideration that the patient might be at risk for infection with the SARS-CoV-2 virus that causes COVID-19. Institutional protocols and algorithms that pertain to the evaluation of patients at risk for COVID-19 are in a state of rapid change based on information released by regulatory bodies including the CDC and federal and state organizations. These policies and algorithms were followed during the patient's care in the ED.     Final Clinical Impressions(s) / ED Diagnoses   Final diagnoses:  Exposure to Covid-19 Virus  Shortness of breath  Cough    ED Discharge Orders         Ordered    ondansetron (ZOFRAN ODT) 4 MG disintegrating tablet  Every 8 hours PRN     11/15/18 1123    benzonatate (TESSALON) 100 MG capsule  Every 8 hours     11/15/18 1123           Anselm PancoastJoy, Malani Lees C, PA-C 11/15/18 1127    Little, Ambrose Finlandachel Morgan, MD 11/15/18 1218

## 2020-10-06 IMAGING — DX PORTABLE CHEST - 1 VIEW
1 series · 1 of 1 positions shown · non-contrast
Comparison: None.

CLINICAL DATA: Cough, shortness of breath

EXAM:
PORTABLE CHEST 1 VIEW

[chest ap]
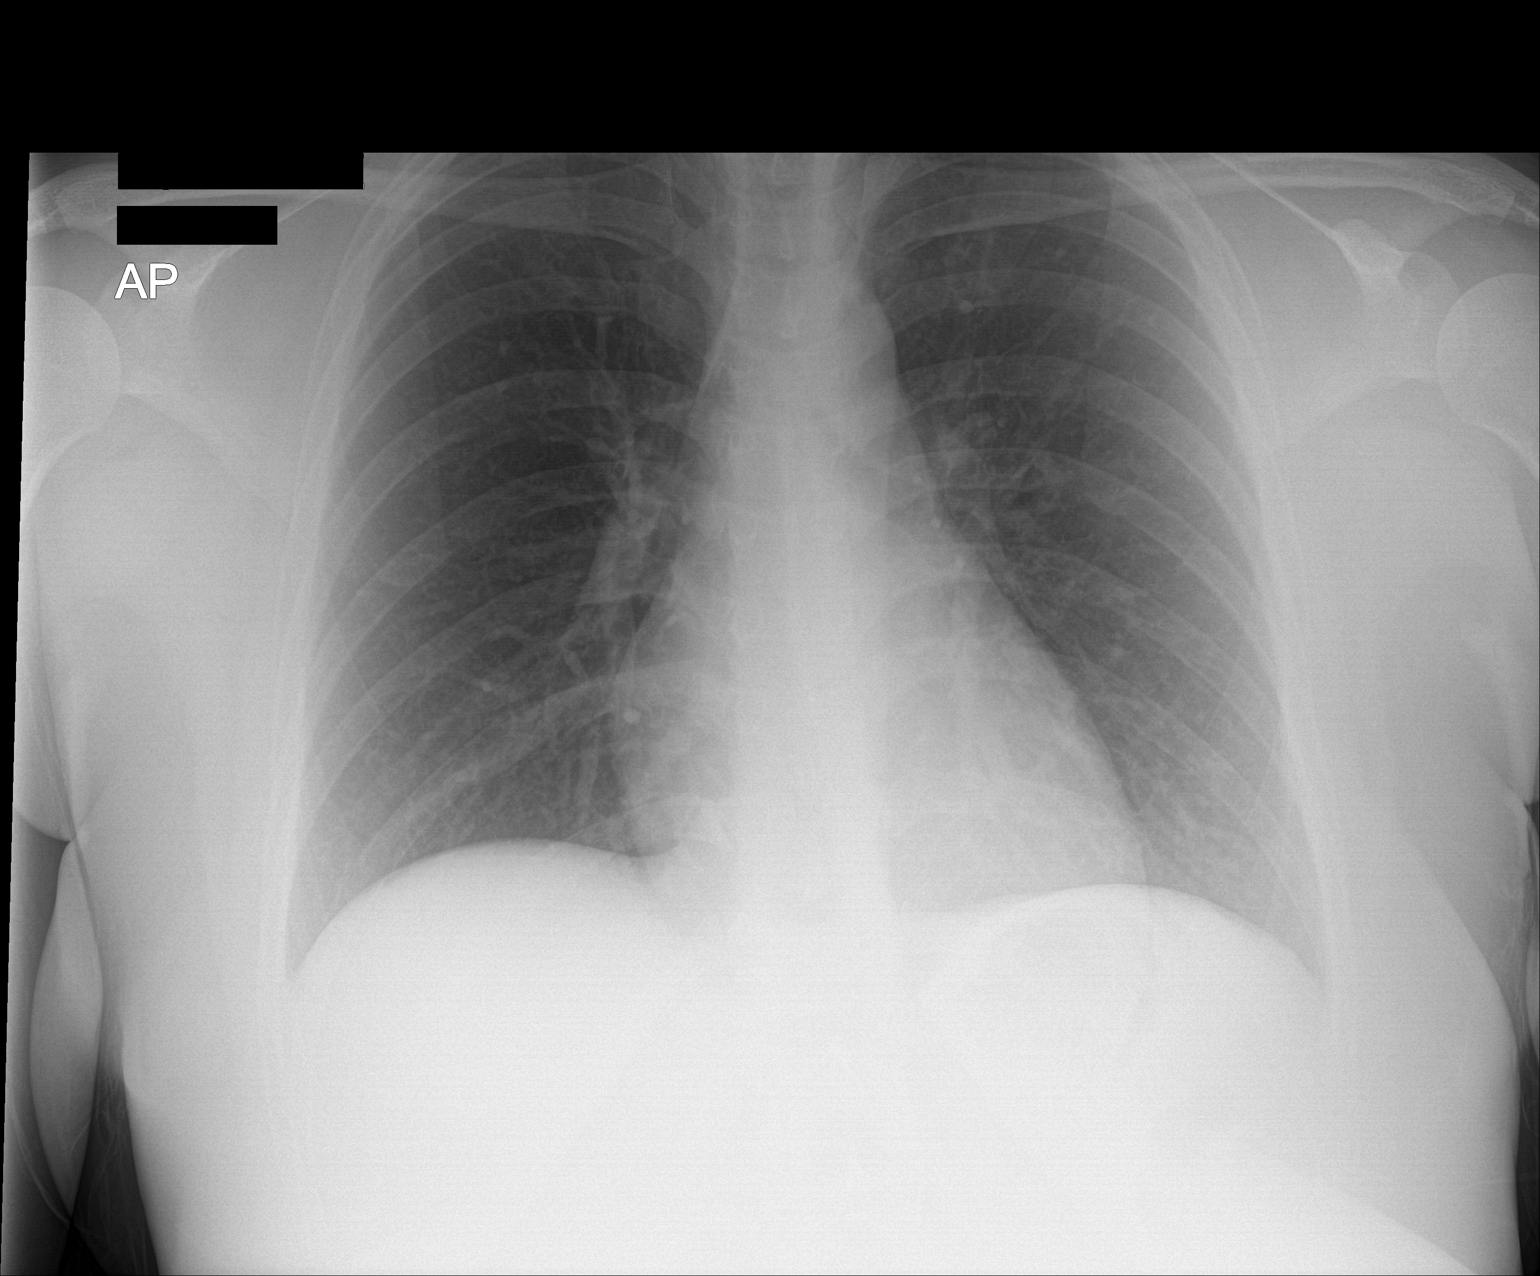

[1 of 1 positions shown; findings below may reference images not displayed]

FINDINGS: The heart size and mediastinal contours are within normal limits.
Both lungs are clear. The visualized skeletal structures are
unremarkable.
IMPRESSION: No active disease.

## 2021-07-15 ENCOUNTER — Emergency Department: Admit: 2021-07-15 | Discharge status: Absent without leave | Payer: Self-pay

## 2021-07-15 ENCOUNTER — Emergency Department (INDEPENDENT_AMBULATORY_CARE_PROVIDER_SITE_OTHER)
Admission: EM | Admit: 2021-07-15 | Discharge: 2021-07-15 | Disposition: A | Discharge status: Home / Self Care | Payer: 59 | Source: Home / Self Care | Attending: Family Medicine | Admitting: Family Medicine

## 2021-07-15 ENCOUNTER — Other Ambulatory Visit: Payer: Self-pay

## 2021-07-15 DIAGNOSIS — J309 Allergic rhinitis, unspecified: Secondary | ICD-10-CM | POA: Diagnosis not present

## 2021-07-15 DIAGNOSIS — U071 COVID-19: Secondary | ICD-10-CM | POA: Diagnosis not present

## 2021-07-15 DIAGNOSIS — R059 Cough, unspecified: Secondary | ICD-10-CM | POA: Diagnosis not present

## 2021-07-15 LAB — POC SARS CORONAVIRUS 2 AG -  ED: SARS Coronavirus 2 Ag: POSITIVE — AB

## 2021-07-15 LAB — POCT INFLUENZA A/B
Influenza A, POC: NEGATIVE
Influenza B, POC: NEGATIVE

## 2021-07-15 MED ORDER — BENZONATATE 200 MG PO CAPS: 200.0000 mg | ORAL_CAPSULE | Freq: Three times a day (TID) | ORAL | 0 refills | Status: AC | PRN

## 2021-07-15 MED ORDER — FEXOFENADINE HCL 180 MG PO TABS: 180.0000 mg | ORAL_TABLET | Freq: Every day | ORAL | 0 refills | Status: AC

## 2021-07-15 NOTE — ED Triage Notes (Signed)
Pt states that she has some nasal congestion, chills, low grade fever and cough. X3 days  Pt states that she is vaccinated for covid.  Pt states that she hasn't had flu vaccine.

## 2021-07-15 NOTE — ED Provider Notes (Signed)
[facility]    CSN: [id number] Arrival date & time: 07/15/21  8676      History   Chief Complaint Chief Complaint  Patient presents with   Nasal Congestion    Nasal congestion, chills, and low grade fever. X3 days    HPI Anita Christensen is a 30 y.o. female.   HPI 30 year old female presents with nasal congestion, chills, low-grade fever x 3 days.  Reports is vaccinated for COVID-19 and has not been vaccinated for Influenza.   Past Medical History:  Diagnosis Date   Depression     Patient Active Problem List   Diagnosis Date Noted   Malaise and fatigue 12/30/2015   Arthralgia of multiple joints 12/30/2015    Past Surgical History:  Procedure Laterality Date   WISDOM TOOTH EXTRACTION  2013    OB History   No obstetric history on file.      Home Medications    Prior to Admission medications   Medication Sig Start Date End Date Taking? Authorizing Provider  benzonatate (TESSALON) 200 MG capsule Take 1 capsule (200 mg total) by mouth 3 (three) times daily as needed for up to 7 days for cough. 07/15/21 07/22/21 Yes Trevor Iha, FNP  fexofenadine [facility] ALLERGY) 180 MG tablet Take 1 tablet (180 mg total) by mouth daily for 15 days. 07/15/21 07/30/21 Yes Trevor Iha, FNP  ibuprofen (ADVIL,MOTRIN) 600 MG tablet Take 1 tablet (600 mg total) by mouth every 6 (six) hours as needed. 09/01/18   Elson Areas, PA-C  methocarbamol (ROBAXIN) 500 MG tablet Take 1 tablet (500 mg total) by mouth 2 (two) times daily. 09/01/18   Elson Areas, PA-C  ondansetron (ZOFRAN ODT) 4 MG disintegrating tablet Take 1 tablet (4 mg total) by mouth every 8 (eight) hours as needed for nausea or vomiting. 11/15/18   Joy, Hillard Danker, PA-C    Family History Family History  Problem Relation Age of Onset   Cancer Mother        breaast    Social History Social History   Tobacco Use   Smoking status: Former    Packs/day: 0.25    Types: Cigarettes   Smokeless tobacco: Never   Vaping Use   Vaping Use: Every day  Substance Use Topics   Alcohol use: Not Currently    Comment: occasional   Drug use: No     Allergies   Patient has no known allergies.   Review of Systems Review of Systems  Constitutional:  Positive for chills.  HENT:  Positive for congestion.   Respiratory:  Positive for cough.   All other systems reviewed and are negative.tess   Physical Exam Triage Vital Signs ED Triage Vitals  Enc Vitals Group     BP 07/15/21 1042 129/84     Pulse Rate 07/15/21 1042 93     Resp 07/15/21 1042 18     Temp 07/15/21 1042 99 F (37.2 C)     Temp src --      SpO2 07/15/21 1042 96 %     Weight 07/15/21 1039 200 lb (90.7 kg)     Height 07/15/21 1039 5\' 6"  (1.676 m)     Head Circumference --      Peak Flow --      Pain Score 07/15/21 1038 7     Pain Loc --      Pain Edu? --      Excl. in GC? --    No data found.  Updated Vital Signs BP 129/84 (BP Location: Left Arm)    Pulse 93    Temp 99 F (37.2 C)    Resp 18    Ht 5\' 6"  (1.676 m)    Wt 200 lb (90.7 kg)    LMP 07/05/2021    SpO2 96%    BMI 32.28 kg/m       Physical Exam Vitals and nursing note reviewed.  Constitutional:      General: She is not in acute distress.    Appearance: Normal appearance. She is obese.  HENT:     Right Ear: Tympanic membrane, ear canal and external ear normal.     Left Ear: Tympanic membrane, ear canal and external ear normal.     Mouth/Throat:     Mouth: Mucous membranes are moist.     Pharynx: Oropharynx is clear.  Eyes:     Extraocular Movements: Extraocular movements intact.     Conjunctiva/sclera: Conjunctivae normal.     Pupils: Pupils are equal, round, and reactive to light.  Cardiovascular:     Rate and Rhythm: Normal rate and regular rhythm.     Pulses: Normal pulses.     Heart sounds: Normal heart sounds.  Pulmonary:     Effort: Pulmonary effort is normal.     Breath sounds: Normal breath sounds. No wheezing, rhonchi or rales.     Comments:  Infrequent nonproductive cough noted on exam Musculoskeletal:     Cervical back: Normal range of motion and neck supple.  Skin:    General: Skin is warm and dry.  Neurological:     General: No focal deficit present.     Mental Status: She is alert and oriented to person, place, and time.     UC Treatments / Results  Labs (all labs ordered are listed, but only abnormal results are displayed) Labs Reviewed  POC SARS CORONAVIRUS 2 AG -  ED - Abnormal; Notable for the following components:      Result Value   SARS Coronavirus 2 Ag Positive (*)    All other components within normal limits  POCT INFLUENZA A/B - Normal    EKG   Radiology No results found.  Procedures Procedures (including critical care time)  Medications Ordered in UC Medications - No data to display  Initial Impression / Assessment and Plan / UC Course  I have reviewed the triage vital signs and the nursing notes.  Pertinent labs & imaging results that were available during my care of the patient were reviewed by me and considered in my medical decision making (see chart for details).     MDM: 1.  COVID-19-advised/encouraged patient to self quarantine from 07/16/2021 to 07/25/2021. 2. Allergic rhinitis-Rx'd Allegra; 3.  Cough-Rx'd Tessalon Perles. Advised patient to self quarantine from 07/16/2021 to 07/25/2021.  Advised patient if asymptomatic and afebrile may self quarantine for 5 days or from 07/16/2021 to 07/20/2021.  Advised/encouraged patient to take Allegra daily for the next 5 days for concurrent postnasal drainage/drip; may use afterwards as needed.  Advised patient may use Tessalon Perles daily or as needed for cough.  Encouraged patient increase daily water intake while taking these medications.  Patient discharged home, hemodynamically stable. Final Clinical Impressions(s) / UC Diagnoses   Final diagnoses:  COVID-19  Allergic rhinitis, unspecified seasonality, unspecified trigger  Cough, unspecified  type     Discharge Instructions      Advised patient to self quarantine from 07/16/2021 to 07/25/2021.  Advised patient if asymptomatic and afebrile  may self quarantine for 5 days or from 07/16/2021 to 07/20/2021.  Advised/encouraged patient to take Allegra daily for the next 5 days for concurrent postnasal drainage/drip; may use afterwards as needed.  Advised patient may use Tessalon Perles daily or as needed for cough.  Encouraged patient increase daily water intake while taking these medications.     ED Prescriptions     Medication Sig Dispense Auth. Provider   benzonatate (TESSALON) 200 MG capsule Take 1 capsule (200 mg total) by mouth 3 (three) times daily as needed for up to 7 days for cough. 40 capsule Trevor Iha, FNP   fexofenadine [facility] ALLERGY) 180 MG tablet Take 1 tablet (180 mg total) by mouth daily for 15 days. 15 tablet Trevor Iha, FNP      PDMP not reviewed this encounter.   Trevor Iha, FNP 07/15/21 1218

## 2021-07-15 NOTE — Discharge Instructions (Addendum)
Advised patient to self quarantine from 07/16/2021 to 07/25/2021.  Advised patient if asymptomatic and afebrile may self quarantine for 5 days or from 07/16/2021 to 07/20/2021.  Advised/encouraged patient to take Allegra daily for the next 5 days for concurrent postnasal drainage/drip; may use afterwards as needed.  Advised patient may use Tessalon Perles daily or as needed for cough.  Encouraged patient increase daily water intake while taking these medications.
